# Patient Record
Sex: Female | Born: 1960 | Race: White | Hispanic: No | Marital: Married | Smoking: Current every day smoker
Health system: Southern US, Community
[De-identification: ages and names within clinical notes are randomized; demographics above are authoritative.]

## PROBLEM LIST (undated history)

## (undated) DIAGNOSIS — Z8619 Personal history of other infectious and parasitic diseases: Secondary | ICD-10-CM

## (undated) DIAGNOSIS — N843 Polyp of vulva: Secondary | ICD-10-CM

## (undated) DIAGNOSIS — N9089 Other specified noninflammatory disorders of vulva and perineum: Secondary | ICD-10-CM

## (undated) DIAGNOSIS — Z8742 Personal history of other diseases of the female genital tract: Secondary | ICD-10-CM

## (undated) DIAGNOSIS — E559 Vitamin D deficiency, unspecified: Secondary | ICD-10-CM

## (undated) DIAGNOSIS — R5383 Other fatigue: Secondary | ICD-10-CM

## (undated) DIAGNOSIS — O99345 Other mental disorders complicating the puerperium: Secondary | ICD-10-CM

## (undated) DIAGNOSIS — IMO0002 Reserved for concepts with insufficient information to code with codable children: Secondary | ICD-10-CM

## (undated) DIAGNOSIS — B379 Candidiasis, unspecified: Secondary | ICD-10-CM

## (undated) DIAGNOSIS — F53 Postpartum depression: Secondary | ICD-10-CM

## (undated) HISTORY — DX: Vitamin D deficiency, unspecified: E55.9

## (undated) HISTORY — DX: Other mental disorders complicating the puerperium: O99.345

## (undated) HISTORY — DX: Personal history of other diseases of the female genital tract: Z87.42

## (undated) HISTORY — DX: Other specified noninflammatory disorders of vulva and perineum: N90.89

## (undated) HISTORY — DX: Other fatigue: R53.83

## (undated) HISTORY — DX: Reserved for concepts with insufficient information to code with codable children: IMO0002

## (undated) HISTORY — DX: Candidiasis, unspecified: B37.9

## (undated) HISTORY — DX: Postpartum depression: F53.0

## (undated) HISTORY — DX: Polyp of vulva: N84.3

## (undated) HISTORY — DX: Personal history of other infectious and parasitic diseases: Z86.19

---
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
## 1989-11-05 HISTORY — PX: OTHER SURGICAL HISTORY: SHX169

## 1997-11-05 DIAGNOSIS — IMO0002 Reserved for concepts with insufficient information to code with codable children: Secondary | ICD-10-CM

## 1997-11-05 DIAGNOSIS — R87619 Unspecified abnormal cytological findings in specimens from cervix uteri: Secondary | ICD-10-CM

## 1997-11-05 HISTORY — DX: Reserved for concepts with insufficient information to code with codable children: IMO0002

## 1997-11-05 HISTORY — DX: Unspecified abnormal cytological findings in specimens from cervix uteri: R87.619

## 2006-12-23 DIAGNOSIS — N9089 Other specified noninflammatory disorders of vulva and perineum: Secondary | ICD-10-CM

## 2006-12-23 HISTORY — DX: Other specified noninflammatory disorders of vulva and perineum: N90.89

## 2007-01-06 DIAGNOSIS — N843 Polyp of vulva: Secondary | ICD-10-CM

## 2007-01-06 HISTORY — DX: Polyp of vulva: N84.3

## 2010-07-31 DIAGNOSIS — R5383 Other fatigue: Secondary | ICD-10-CM

## 2010-07-31 HISTORY — DX: Other fatigue: R53.83

## 2010-08-10 DIAGNOSIS — Z8742 Personal history of other diseases of the female genital tract: Secondary | ICD-10-CM

## 2010-08-10 HISTORY — DX: Personal history of other diseases of the female genital tract: Z87.42

## 2010-11-03 ENCOUNTER — Ambulatory Visit (HOSPITAL_COMMUNITY)
Admission: RE | Admit: 2010-11-03 | Discharge: 2010-11-03 | Payer: Self-pay | Source: Home / Self Care | Attending: Obstetrics and Gynecology | Admitting: Obstetrics and Gynecology

## 2010-11-03 HISTORY — PX: DILATION AND CURETTAGE OF UTERUS: SHX78

## 2010-11-03 HISTORY — PX: HYSTEROSCOPY W/ ENDOMETRIAL ABLATION: SUR665

## 2011-01-15 LAB — CBC
HCT: 37.9 % (ref 36.0–46.0)
Hemoglobin: 12.8 g/dL (ref 12.0–15.0)
MCH: 29.4 pg (ref 26.0–34.0)
MCHC: 33.8 g/dL (ref 30.0–36.0)

## 2011-02-08 ENCOUNTER — Other Ambulatory Visit: Payer: Self-pay | Admitting: Dermatology

## 2011-03-20 ENCOUNTER — Other Ambulatory Visit: Payer: Self-pay | Admitting: Dermatology

## 2011-08-21 ENCOUNTER — Other Ambulatory Visit: Payer: Self-pay | Admitting: Dermatology

## 2012-02-25 ENCOUNTER — Other Ambulatory Visit: Payer: Self-pay | Admitting: Dermatology

## 2012-05-15 ENCOUNTER — Ambulatory Visit (INDEPENDENT_AMBULATORY_CARE_PROVIDER_SITE_OTHER): Payer: BC Managed Care – PPO | Admitting: Obstetrics and Gynecology

## 2012-05-15 ENCOUNTER — Encounter: Payer: Self-pay | Admitting: Obstetrics and Gynecology

## 2012-05-15 VITALS — BP 112/72 | HR 72 | Ht 63.75 in | Wt 127.0 lb

## 2012-05-15 DIAGNOSIS — Z124 Encounter for screening for malignant neoplasm of cervix: Secondary | ICD-10-CM

## 2012-05-15 DIAGNOSIS — Z01419 Encounter for gynecological examination (general) (routine) without abnormal findings: Secondary | ICD-10-CM

## 2012-05-15 NOTE — Progress Notes (Signed)
Subjective:    Martha Smith is a 51 y.o. female, G2P2002, who presents for an annual exam. The patient reports no complaints.  Menstrual cycle:   LMP: No LMP recorded. Patient has had an ablation.             Review of Systems Pertinent items are noted in HPI. Denies pelvic pain, urinary tract symptoms, vaginitis symptoms, irregular bleeding, menopausal symptoms, change in bowel habits or rectal bleeding   Objective:    BP 112/72  Pulse 72  Ht 5' 3.75" (1.619 m)  Wt 127 lb (57.607 kg)  BMI 21.97 kg/m2    Wt Readings from Last 1 Encounters:  05/15/12 127 lb (57.607 kg)   Body mass index is 21.97 kg/(m^2). General Appearance: Alert, no acute distress HEENT: Grossly normal Neck / Thyroid: Supple, no thyromegaly or cervical adenopathy Lungs: Clear to auscultation bilaterally Back: No CVA tenderness Breast Exam: No masses or nodes.No dimpling, nipple retraction or discharge. Cardiovascular: Regular rate and rhythm.  Gastrointestinal: Soft, non-tender, no masses or organomegaly Pelvic Exam: EGBUS-wnl, vagina-normal rugae, cervix- without lesions or tenderness, uterus appears normal size shape and consistency, adnexae-no masses or tenderness Rectovaginal: no masses and normal sphincter tone Lymphatic Exam: Non-palpable nodes in neck, clavicular,  axillary, or inguinal regions  Skin: no rashes or abnormalities Extremities: no clubbing cyanosis or edema  Neurologic: grossly normal Psychiatric: Alert and oriented   Assessment:   Routine GYN Exam   Plan:    PAP sent RTO 1 year or prn  Gonzalo Waymire,ELMIRAPA-C

## 2012-05-15 NOTE — Progress Notes (Signed)
Regular Periods: no Mammogram: yes  Monthly Breast Ex.: yes Exercise: yes  Tetanus < 10 years: yes Seatbelts: yes  NI. Bladder Functn.: yes Abuse at home: no  Daily BM's: no Stressful Work: yes  Healthy Diet: yes Sigmoid-Colonoscopy: no  Calcium: no Medical problems this year: no problems   LAST PAP:2011 nl  Contraception: none  Mammogram:  Longtime need mammo  PCP: Mady Gemma  PMH: NO CHANGE  FMH: NO CHANGE  Last Bone Scan: NO

## 2012-05-16 LAB — PAP IG W/ RFLX HPV ASCU

## 2012-08-27 ENCOUNTER — Other Ambulatory Visit: Payer: Self-pay | Admitting: Dermatology

## 2013-05-04 ENCOUNTER — Other Ambulatory Visit: Payer: Self-pay | Admitting: Dermatology

## 2013-11-03 ENCOUNTER — Other Ambulatory Visit: Payer: Self-pay | Admitting: Dermatology

## 2014-01-11 ENCOUNTER — Other Ambulatory Visit: Payer: Self-pay | Admitting: Dermatology

## 2014-05-04 ENCOUNTER — Other Ambulatory Visit: Payer: Self-pay | Admitting: Dermatology

## 2014-05-10 ENCOUNTER — Other Ambulatory Visit: Payer: Self-pay | Admitting: Family Medicine

## 2014-05-10 DIAGNOSIS — R109 Unspecified abdominal pain: Secondary | ICD-10-CM

## 2014-05-12 ENCOUNTER — Ambulatory Visit
Admission: RE | Admit: 2014-05-12 | Discharge: 2014-05-12 | Disposition: A | Discharge status: Home / Self Care | Payer: BC Managed Care – PPO | Source: Ambulatory Visit | Attending: Family Medicine | Admitting: Family Medicine

## 2014-05-12 DIAGNOSIS — R109 Unspecified abdominal pain: Secondary | ICD-10-CM

## 2014-08-16 ENCOUNTER — Ambulatory Visit (INDEPENDENT_AMBULATORY_CARE_PROVIDER_SITE_OTHER): Payer: BC Managed Care – PPO | Admitting: Podiatry

## 2014-08-16 ENCOUNTER — Encounter: Payer: Self-pay | Admitting: Podiatry

## 2014-08-16 VITALS — BP 140/88 | HR 75 | Resp 13 | Ht 64.5 in | Wt 140.0 lb

## 2014-08-16 DIAGNOSIS — M2041 Other hammer toe(s) (acquired), right foot: Secondary | ICD-10-CM

## 2014-08-16 NOTE — Progress Notes (Signed)
   Subjective:    Patient ID: Martha Smith, female    DOB: 11/15/1960, 53 y.o.   MRN: [id number]  HPI Comments: N corn L right 5th medial toe D years O after beginning to exercise years ago C painful hard skin A enclosed shoes, rubbing toes T medicated corn pads     Review of Systems  Allergic/Immunologic:       Seasonal allergies.  All other systems reviewed and are negative.      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 I advised patient that surgical treatment would be the most definitive treatment and would require minimal standing and walking for approximately 6-8 weeks. Patient interested in surgery at a future date. I dispensed a gelcap sleeve to wear over the fifth toe.  Reappoint at patient's request

## 2014-08-16 NOTE — Patient Instructions (Signed)
Hammer Toes Hammer toes is a condition in which one or more of your toes is permanently flexed. CAUSES  This happens when a muscle imbalance or abnormal bone length makes your small toes buckle. This causes the toe joint to contract and the strong cord-like bands that attach muscles to the bones (tendons) in your toes to shorten.  SIGNS AND SYMPTOMS  Common symptoms of flexible hammer toes include:   A buildup of skin cells (corns). Corns occur where boney bumps come in frequent contact with hard surfaces. For example, where your shoes press and rub.  Irritation.  Inflammation.  Pain.  Limited motion in your toes. DIAGNOSIS  Hammer toes are diagnosed through a physical exam of your toes. During the exam, your health care provider may try to reproduce your symptoms by manipulating your foot. Often, X-ray exams are done to determine the degree of deformity and to make sure that the cause is not a fracture.  TREATMENT  Hammer toes can be treated with corrective surgery. There are several types of surgical procedures that can treat hammer toes. The most common procedures include:  Arthroplasty--A portion of the joint is surgically removed and your toe is straightened. The gap fills in with fibrous tissue. This procedure helps treat pain and deformity and helps restore function.  Fusion--Cartilage between the two bones of the affected joint is taken out and the bones fuse together into one longer bone. This helps keep your toe stable and reduces pain but leaves your toe stiff, yet straight.  Implantation--A portion of your bone is removed and replaced with an implant to restore motion.  Flexor tendon transfers--This procedure repositions the tendons that curl the toes down (flexor tendons). This may be done to release the deforming force that causes your toe to buckle. Several of these procedures require fixing your toe with a pin that is visible at the tip of your toe. The pin keeps the toe  straight during healing. Your health care provider will remove the pin usually within 4-8 weeks after the procedure.  Document Released: 10/19/2000 Document Revised: 10/27/2013 Document Reviewed: 06/29/2013 [employer] Patient Information 2015 [employer]. This information is not intended to replace advice given to you by your health care provider. Make sure you discuss any questions you have with your health care provider.  

## 2014-08-17 ENCOUNTER — Encounter: Payer: Self-pay | Admitting: Podiatry

## 2014-09-06 ENCOUNTER — Encounter: Payer: Self-pay | Admitting: Podiatry

## 2014-11-04 ENCOUNTER — Other Ambulatory Visit: Payer: Self-pay | Admitting: Dermatology

## 2016-03-28 DIAGNOSIS — K1379 Other lesions of oral mucosa: Secondary | ICD-10-CM | POA: Insufficient documentation

## 2016-03-28 DIAGNOSIS — R232 Flushing: Secondary | ICD-10-CM | POA: Insufficient documentation

## 2016-03-28 DIAGNOSIS — K429 Umbilical hernia without obstruction or gangrene: Secondary | ICD-10-CM | POA: Insufficient documentation

## 2017-05-22 ENCOUNTER — Ambulatory Visit
Admission: RE | Admit: 2017-05-22 | Discharge: 2017-05-22 | Disposition: A | Discharge status: Home / Self Care | Payer: BC Managed Care – PPO | Source: Ambulatory Visit | Attending: Family Medicine | Admitting: Family Medicine

## 2017-05-22 ENCOUNTER — Other Ambulatory Visit: Payer: Self-pay | Admitting: Family Medicine

## 2017-05-22 DIAGNOSIS — S0990XA Unspecified injury of head, initial encounter: Secondary | ICD-10-CM

## 2017-07-25 DIAGNOSIS — E210 Primary hyperparathyroidism: Secondary | ICD-10-CM | POA: Insufficient documentation

## 2018-02-04 IMAGING — CT CT HEAD W/O CM
3 series · 15 of 47 positions shown, 18 images · non-contrast
Comparison: None.

CLINICAL DATA: 56-year-old female with 10 days of headaches. Blunt
trauma to the posterior head on [REDACTED].

EXAM:
CT HEAD WITHOUT CONTRAST
TECHNIQUE: Contiguous axial images were obtained from the base of the skull
through the vertex without intravenous contrast.

[Series 2: head w/(date) · axial · 0.39mm/px · z∈[+195,+320]mm · 9 of 30 slices shown, 12 images]
[im 3/30  brain]
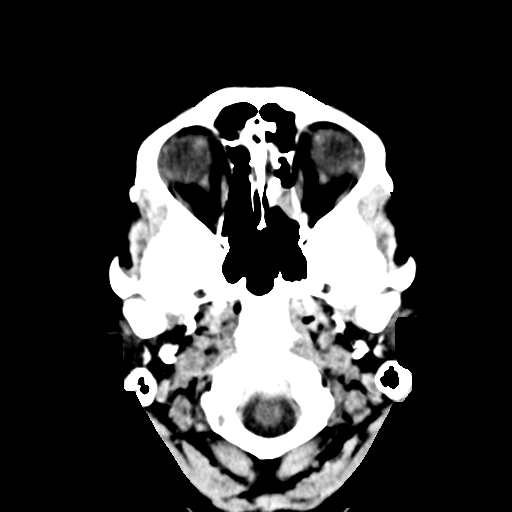
[im 3/30  bone]
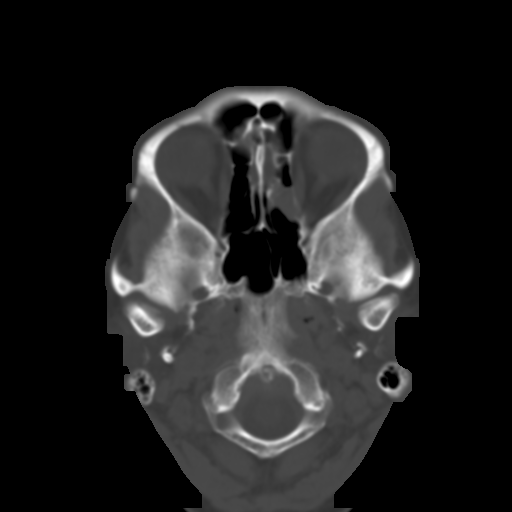
[im 6/30  brain]
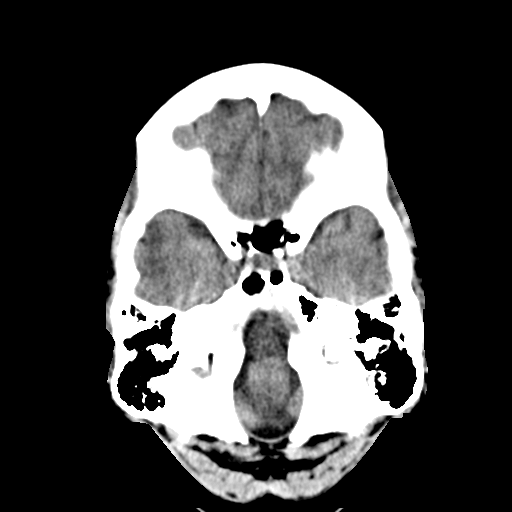
[im 9/30  brain]
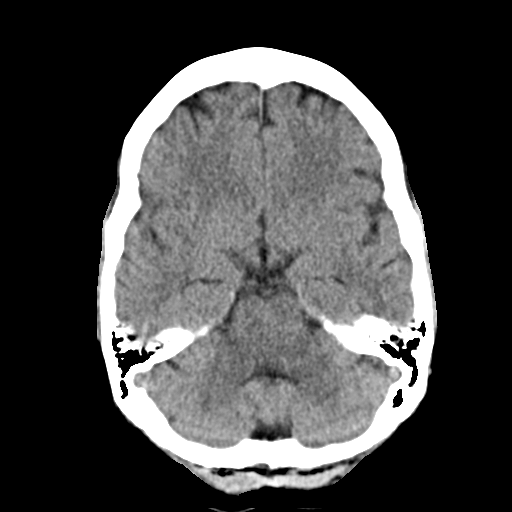
[im 12/30  brain]
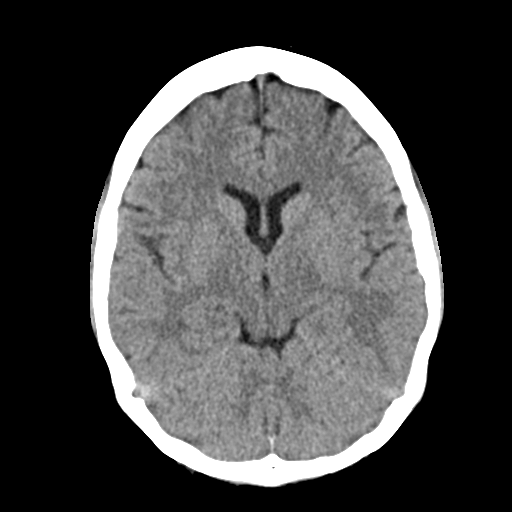
[im 16/30  brain]
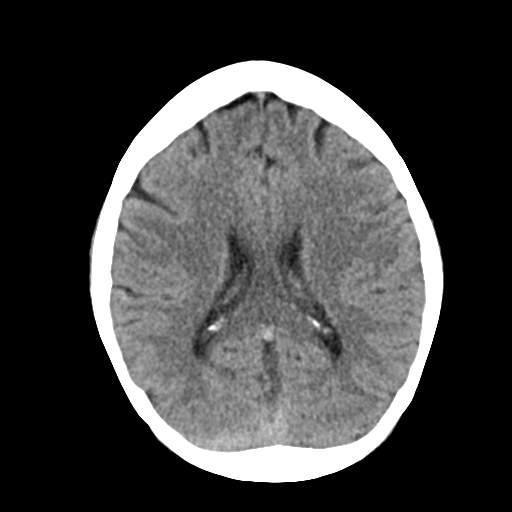
[im 16/30  bone]
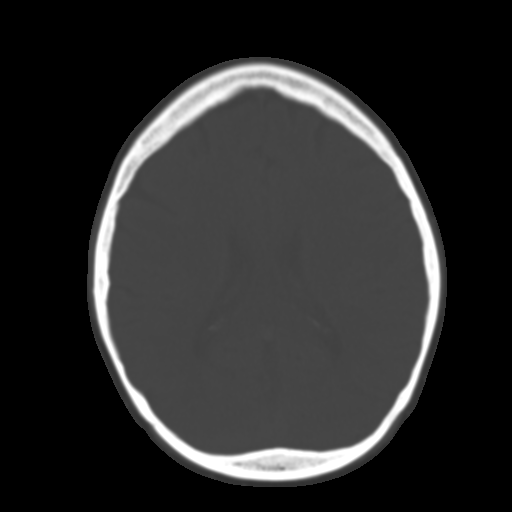
[im 19/30  brain]
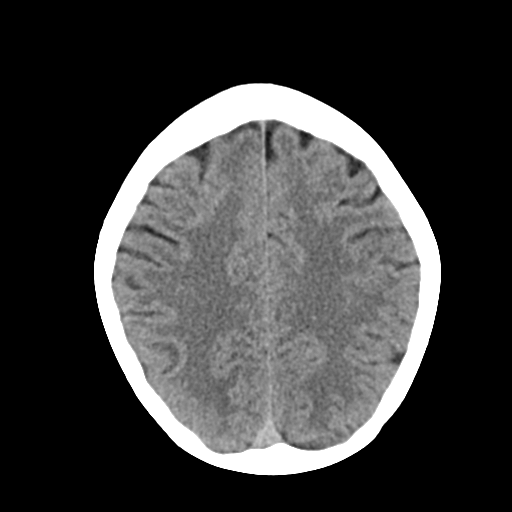
[im 22/30  brain]
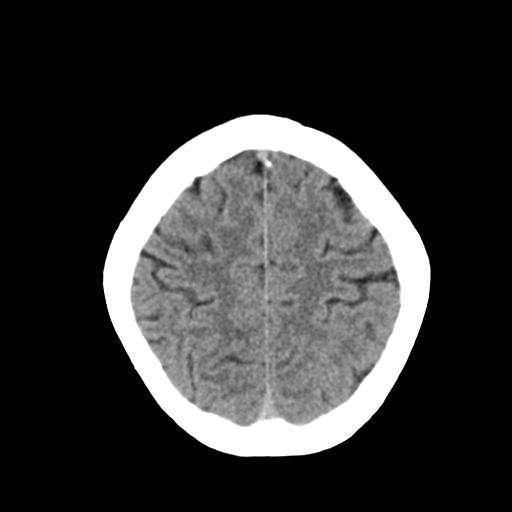
[im 25/30  brain]
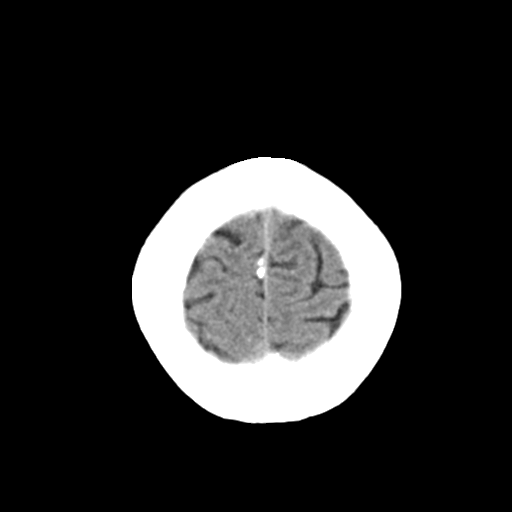
[im 28/30  brain]
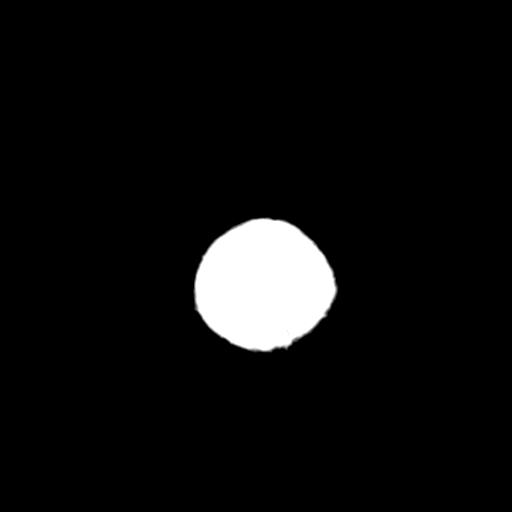
[im 28/30  bone]
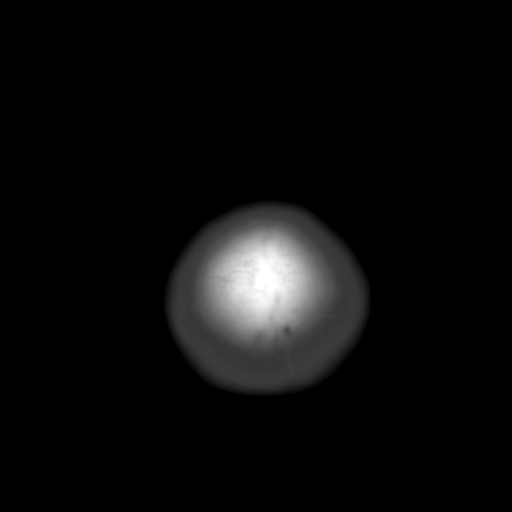

[Series 4: cor · coronal · 0.27mm/px · 3 of 59 slices shown]
[im 20/59  brain]
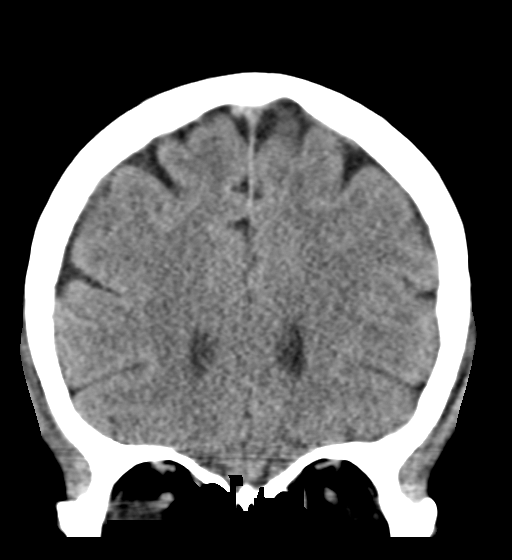
[im 26/59  brain]
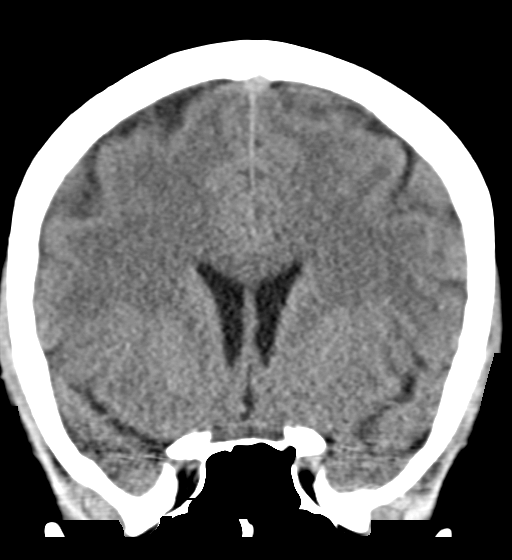
[im 33/59  brain]
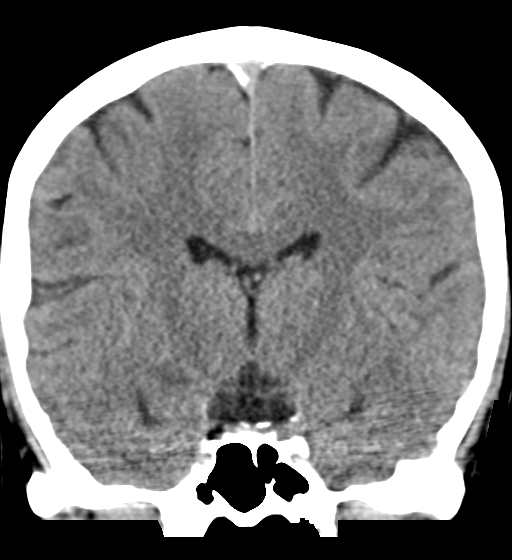

[Series 5: sag · sagittal · 0.29mm/px · 3 of 49 slices shown]
[im 17/49  brain]
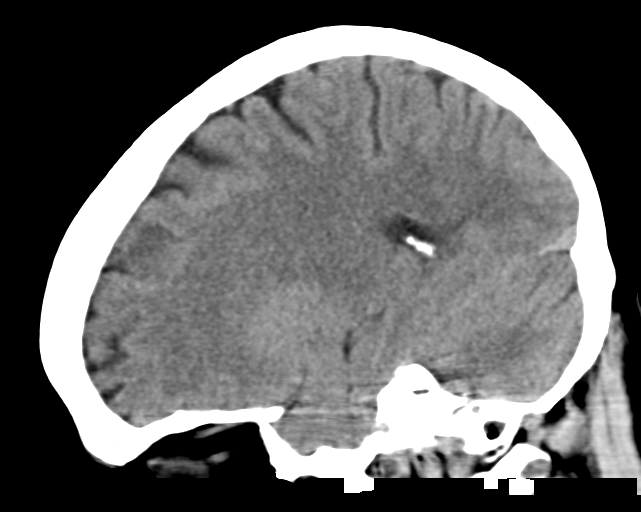
[im 25/49  brain]
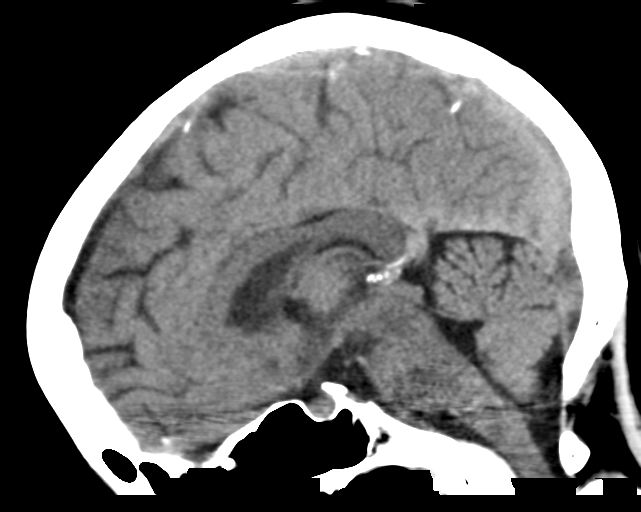
[im 33/49  brain]
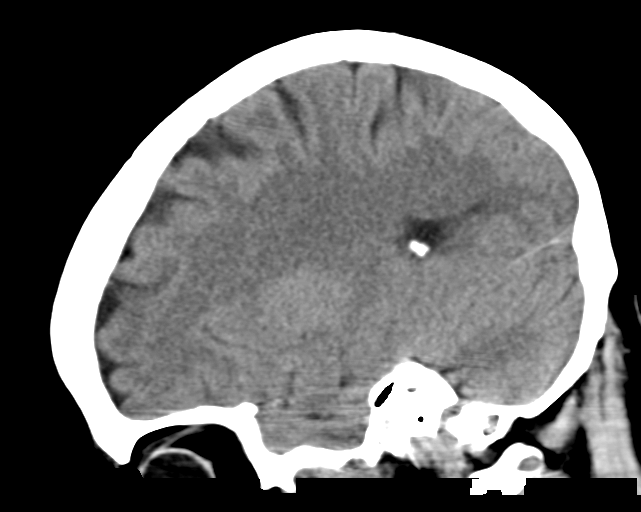

[15 of 47 positions shown; findings below may reference images not displayed]

FINDINGS: Brain: No midline shift, ventriculomegaly, mass effect, evidence of
mass lesion, intracranial hemorrhage or evidence of cortically based
acute infarction. Gray-white matter differentiation is within normal
limits throughout the brain. Cerebral volume is within normal
limits.

Vascular: No suspicious intracranial vascular hyperdensity.

Skull: No skull osseous abnormality identified. Partially visible
degenerative changes about the anterior C1 ring and odontoid.

Sinuses/Orbits: Partial opacification of left posterior ethmoid air
cells. Other visible paranasal sinuses and mastoids are well
pneumatized.

Other: No scalp hematoma. Visualized orbits and scalp soft tissues
are within normal limits.
IMPRESSION: 1. Normal non contrast appearance of the brain. No acute traumatic
injury identified.
2. Mild left ethmoid sinus inflammation.

## 2019-06-01 ENCOUNTER — Other Ambulatory Visit: Payer: Self-pay

## 2019-06-01 ENCOUNTER — Ambulatory Visit: Payer: BC Managed Care – PPO | Admitting: Podiatry

## 2019-06-01 ENCOUNTER — Encounter: Payer: Self-pay | Admitting: Podiatry

## 2019-06-01 ENCOUNTER — Ambulatory Visit (INDEPENDENT_AMBULATORY_CARE_PROVIDER_SITE_OTHER): Payer: BC Managed Care – PPO

## 2019-06-01 VITALS — BP 140/91 | HR 82 | Temp 97.5°F | Resp 16

## 2019-06-01 DIAGNOSIS — M79672 Pain in left foot: Secondary | ICD-10-CM | POA: Diagnosis not present

## 2019-06-01 DIAGNOSIS — M722 Plantar fascial fibromatosis: Secondary | ICD-10-CM

## 2019-06-01 DIAGNOSIS — M7732 Calcaneal spur, left foot: Secondary | ICD-10-CM | POA: Diagnosis not present

## 2019-06-01 MED ORDER — MELOXICAM 15 MG PO TABS: 15.0000 mg | ORAL_TABLET | Freq: Every day | ORAL | 0 refills | Status: DC

## 2019-06-01 NOTE — Progress Notes (Signed)
Subjective:   Patient ID: Martha Smith, female   DOB: 58 y.o.   MRN: [id number]   HPI 58 year old female presents the office with concerns of left heel pain which is been ongoing for the last several months.  She has been getting worse recently hurts on the morning when she first gets up or for periods of rest.  She did recently purchase a night splint which is been helpful.  She is also been icing as well as an ibuprofen Tylenol.  She denies any recent injury or trauma.  No swelling.  No radiating pain.  The pain does not wake her up at night.  She has no other concerns today.   Review of Systems  All other systems reviewed and are negative.  Past Medical History:  Diagnosis Date  . Abnormal Pap smear 1999   Precancerous cells burnt from cervix  . Fatigue 07/31/10  . H/O menorrhagia 08/10/10  . H/O varicella   . Lesion of vulva 12/23/06  . Polyp of vulva 01/06/07   Right  . Postpartum depression   . Vitamin D insufficiency   . Yeast infection     Past Surgical History:  Procedure Laterality Date  . APPENDECTOMY  1975  . arthroscopy  1991   Left  . CESAREAN SECTION     x 2  . DILATION AND CURETTAGE OF UTERUS  11/03/10  . HYSTEROSCOPY W/ ENDOMETRIAL ABLATION  11/03/10     Current Outpatient Medications:  .  montelukast (SINGULAIR) 10 MG tablet, TAKE 1 TABLET BY MOUTH EVERY DAY NEED MED CHECK BEFORE NEXT REFILL, Disp: , Rfl:  .  cetirizine (ZYRTEC) 10 MG tablet, Take 10 mg by mouth daily., Disp: , Rfl:  .  meloxicam (MOBIC) 15 MG tablet, Take 1 tablet (15 mg total) by mouth daily., Disp: 30 tablet, Rfl: 0  Allergies  Allergen Reactions  . Epinephrine     Heart races.  . Penicillins   . Prednisone   . Sulfa Antibiotics Nausea Only  . Doxycycline     Other reaction(s): Other (See Comments) Tongue tingling         Objective:  Physical Exam  General: AAO x3, NAD  Dermatological: Skin is warm, dry and supple bilateral. Nails x 10 are well manicured; remaining  integument appears unremarkable at this time. There are no open sores, no preulcerative lesions, no rash or signs of infection present.  Vascular: Dorsalis Pedis artery and Posterior Tibial artery pedal pulses are 2/4 bilateral with immedate capillary fill time. Pedal hair growth present. No varicosities and no lower extremity edema present bilateral. There is no pain with calf compression, swelling, warmth, erythema.   Neruologic: Grossly intact via light touch bilateral. Vibratory intact via tuning fork bilateral. Protective threshold with Semmes Wienstein monofilament intact to all pedal sites bilateral.  Negative Tinel sign.  Musculoskeletal: Tenderness to palpation along the plantar medial tubercle of the calcaneus at the insertion of plantar fascia on the let foot. There is no pain along the course of the plantar fascia within the arch of the foot. Plantar fascia appears to be intact. There is no pain with lateral compression of the calcaneus or pain with vibratory sensation. There is no pain along the course or insertion of the achilles tendon. No other areas of tenderness to bilateral lower extremities. Muscular strength 5/5 in all groups tested bilateral.  Gait: Unassisted, Nonantalgic.       Assessment:   Left heel pain, plantar fasciitis     Plan:  -  Treatment options discussed including all alternatives, risks, and complications -Etiology of symptoms were discussed -X-rays were obtained and reviewed with the patient. No evidence of acute fracture or stress fracture.  Calcaneal spurring is present. -Steroid injection performed.  See procedure note below.  Although she has an allergy to prednisone she states that she has tolerated steroid injections performed without issue -Plantar fascial brace dispensed -Prescribed mobic. Discussed side effects of the medication and directed to stop if any are to occur and call the office.  -Discussed stretching, icing exercises daily -Discussed  shoe modifications and orthotics -Continue night splint  Procedure: Injection Tendon/Ligament Discussed alternatives, risks, complications and verbal consent was obtained.  Location: Left plantar fascia at the glabrous junction; medial approach. Skin Prep: Alcohol Injectate: 0.5cc 0.5% marcaine plain, 0.5 cc 2% lidocaine plain and, 1 cc kenalog 10. Disposition: Patient tolerated procedure well. Injection site dressed with a band-aid.  Post-injection care was discussed and return precautions discussed.   Return in about 3 weeks (around 06/22/2019).  Vivi BarrackMatthew R  DPM

## 2019-06-01 NOTE — Patient Instructions (Signed)

## 2019-06-22 ENCOUNTER — Ambulatory Visit: Payer: BC Managed Care – PPO | Admitting: Podiatry

## 2019-06-22 ENCOUNTER — Other Ambulatory Visit: Payer: Self-pay | Admitting: Podiatry

## 2019-07-15 ENCOUNTER — Other Ambulatory Visit: Payer: Self-pay | Admitting: Podiatry

## 2019-08-24 ENCOUNTER — Ambulatory Visit: Payer: BC Managed Care – PPO | Admitting: Podiatry

## 2019-08-24 ENCOUNTER — Ambulatory Visit (INDEPENDENT_AMBULATORY_CARE_PROVIDER_SITE_OTHER): Payer: BC Managed Care – PPO

## 2019-08-24 ENCOUNTER — Other Ambulatory Visit: Payer: Self-pay

## 2019-08-24 DIAGNOSIS — S9031XA Contusion of right foot, initial encounter: Secondary | ICD-10-CM | POA: Diagnosis not present

## 2019-08-24 DIAGNOSIS — S93601A Unspecified sprain of right foot, initial encounter: Secondary | ICD-10-CM | POA: Diagnosis not present

## 2019-08-24 MED ORDER — MELOXICAM 15 MG PO TABS: 15.0000 mg | ORAL_TABLET | Freq: Every day | ORAL | 0 refills | Status: DC

## 2019-08-25 NOTE — Progress Notes (Signed)
Subjective: 58 year old female presents the office with concerns for right injury yesterday she states that she rolled her foot stepping on a rotten tree stump.  She said that she had pain yesterday but today she feels significantly better she is able to put full weight on her foot and able to walk better.  She said that she has iced it as well as elevated.  No swelling.  No bruising. Denies any systemic complaints such as fevers, chills, nausea, vomiting. No acute changes since last appointment, and no other complaints at this time.   Objective: AAO x3, NAD DP/PT pulses palpable bilaterally, CRT less than 3 seconds At this time, it was elicit any area pinpoint tenderness there is no pain vibratory sensation.  There is no significant edema of the foot there is no erythema or warmth.  Flexor, extensor tendons appear to be intact.  Achilles tendon appears to be intact.  Peroneal tendons are intact.  She is pointing more to the lateral aspect of foot where she has discomfort. No pain to the cuboid. No pain with calf compression, swelling, warmth, erythema  Assessment: Right foot sprain  Plan: -All treatment options discussed with the patient including all alternatives, risks, complications.  -X-rays obtained reviewed.  There is a questionable area in the cuboid but is concerned about the clinically there is no pain in this area.  As she is able to walk and there is no obvious tenderness on her continue with wearing a regular, supportive shoe.  Refill meloxicam.  Ice elevation.  We will see her back in 2 weeks if she still having discomfort we will repeat x-rays. -Patient encouraged to call the office with any questions, concerns, change in symptoms.    Trula Slade DPM

## 2019-09-08 ENCOUNTER — Ambulatory Visit: Payer: BC Managed Care – PPO | Admitting: Podiatry

## 2019-09-17 ENCOUNTER — Other Ambulatory Visit: Payer: Self-pay | Admitting: Podiatry

## 2019-10-23 ENCOUNTER — Other Ambulatory Visit: Payer: Self-pay | Admitting: Podiatry
